# Patient Record
Sex: Male | Born: 2000 | Race: Black or African American | Hispanic: No | Marital: Single | Smoking: Never smoker
Health system: Southern US, Community
[De-identification: ages and names within clinical notes are randomized; demographics above are authoritative.]

## PROBLEM LIST (undated history)

## (undated) DIAGNOSIS — J45909 Unspecified asthma, uncomplicated: Secondary | ICD-10-CM

## (undated) HISTORY — PX: SHOULDER SURGERY: SHX246

## (undated) HISTORY — PX: ANTERIOR CRUCIATE LIGAMENT REPAIR: SHX115

---

## 2018-06-30 ENCOUNTER — Encounter (HOSPITAL_COMMUNITY): Payer: Self-pay

## 2018-06-30 ENCOUNTER — Emergency Department (HOSPITAL_COMMUNITY)
Admission: EM | Admit: 2018-06-30 | Discharge: 2018-07-01 | Disposition: A | Discharge status: Home / Self Care | Payer: Medicaid Other | Attending: Emergency Medicine | Admitting: Emergency Medicine

## 2018-06-30 ENCOUNTER — Emergency Department (HOSPITAL_COMMUNITY): Payer: Medicaid Other

## 2018-06-30 ENCOUNTER — Other Ambulatory Visit: Payer: Self-pay

## 2018-06-30 DIAGNOSIS — Y9389 Activity, other specified: Secondary | ICD-10-CM | POA: Diagnosis not present

## 2018-06-30 DIAGNOSIS — J45909 Unspecified asthma, uncomplicated: Secondary | ICD-10-CM | POA: Diagnosis not present

## 2018-06-30 DIAGNOSIS — Y92410 Unspecified street and highway as the place of occurrence of the external cause: Secondary | ICD-10-CM | POA: Insufficient documentation

## 2018-06-30 DIAGNOSIS — Y999 Unspecified external cause status: Secondary | ICD-10-CM | POA: Insufficient documentation

## 2018-06-30 DIAGNOSIS — M542 Cervicalgia: Secondary | ICD-10-CM | POA: Insufficient documentation

## 2018-06-30 DIAGNOSIS — M25512 Pain in left shoulder: Secondary | ICD-10-CM | POA: Diagnosis not present

## 2018-06-30 DIAGNOSIS — R200 Anesthesia of skin: Secondary | ICD-10-CM | POA: Diagnosis not present

## 2018-06-30 HISTORY — DX: Unspecified asthma, uncomplicated: J45.909

## 2018-06-30 MED ORDER — FENTANYL CITRATE (PF) 100 MCG/2ML IJ SOLN
25.0000 ug | Freq: Once | INTRAMUSCULAR | Status: AC
  Administered 2018-06-30: 25 ug via INTRAMUSCULAR
  Filled 2018-06-30: qty 2
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
## 2018-06-30 NOTE — Discharge Instructions (Signed)
Please wear the cervical collar until you follow up with neurosurgery. You were given a referral to the neurosurgery office. Please call the office to make an appointment for follow up.  You were given a prescription for Robaxin which is a muscle relaxer.  You should not drive, work, or operate machinery while taking this medication as it can make you very drowsy.   Please return to the ER sooner if you have any new or worsening symptoms.

## 2018-07-01 ENCOUNTER — Emergency Department (HOSPITAL_COMMUNITY): Payer: Medicaid Other

## 2018-07-01 MED ORDER — GADOBUTROL 1 MMOL/ML IV SOLN
7.5000 mL | Freq: Once | INTRAVENOUS | Status: AC | PRN
  Administered 2018-07-01: 5.5 mL via INTRAVENOUS

## 2018-07-01 MED ORDER — METHOCARBAMOL 500 MG PO TABS: 500.0000 mg | ORAL_TABLET | Freq: Two times a day (BID) | ORAL | 0 refills | Status: AC
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
## 2019-06-05 ENCOUNTER — Emergency Department (HOSPITAL_COMMUNITY): Payer: Medicaid Other

## 2019-06-05 ENCOUNTER — Encounter (HOSPITAL_COMMUNITY): Payer: Self-pay | Admitting: Emergency Medicine

## 2019-06-05 ENCOUNTER — Emergency Department (HOSPITAL_COMMUNITY)
Admission: EM | Admit: 2019-06-05 | Discharge: 2019-06-05 | Disposition: A | Discharge status: Home / Self Care | Payer: Medicaid Other | Attending: Emergency Medicine | Admitting: Emergency Medicine

## 2019-06-05 DIAGNOSIS — J45909 Unspecified asthma, uncomplicated: Secondary | ICD-10-CM | POA: Diagnosis not present

## 2019-06-05 DIAGNOSIS — Z79899 Other long term (current) drug therapy: Secondary | ICD-10-CM | POA: Diagnosis not present

## 2019-06-05 DIAGNOSIS — S41002A Unspecified open wound of left shoulder, initial encounter: Secondary | ICD-10-CM | POA: Diagnosis not present

## 2019-06-05 DIAGNOSIS — Y999 Unspecified external cause status: Secondary | ICD-10-CM | POA: Diagnosis not present

## 2019-06-05 DIAGNOSIS — Y939 Activity, unspecified: Secondary | ICD-10-CM | POA: Diagnosis not present

## 2019-06-05 DIAGNOSIS — W3400XA Accidental discharge from unspecified firearms or gun, initial encounter: Secondary | ICD-10-CM

## 2019-06-05 DIAGNOSIS — Y929 Unspecified place or not applicable: Secondary | ICD-10-CM | POA: Diagnosis not present

## 2019-06-05 LAB — I-STAT CHEM 8, ED
BUN: 10 mg/dL (ref 6–20)
Calcium, Ion: 1.18 mmol/L (ref 1.15–1.40)
Chloride: 101 mmol/L (ref 98–111)
Creatinine, Ser: 0.8 mg/dL (ref 0.61–1.24)
Glucose, Bld: 104 mg/dL — ABNORMAL HIGH (ref 70–99)
HCT: 52 % (ref 39.0–52.0)
Hemoglobin: 17.7 g/dL — ABNORMAL HIGH (ref 13.0–17.0)
Potassium: 3.4 mmol/L — ABNORMAL LOW (ref 3.5–5.1)
Sodium: 141 mmol/L (ref 135–145)
TCO2: 25 mmol/L (ref 22–32)

## 2019-06-05 LAB — COMPREHENSIVE METABOLIC PANEL
ALT: 56 U/L — ABNORMAL HIGH (ref 0–44)
AST: 41 U/L (ref 15–41)
Albumin: 4.6 g/dL (ref 3.5–5.0)
Alkaline Phosphatase: 88 U/L (ref 38–126)
Anion gap: 16 — ABNORMAL HIGH (ref 5–15)
BUN: 11 mg/dL (ref 6–20)
CO2: 22 mmol/L (ref 22–32)
Calcium: 9.8 mg/dL (ref 8.9–10.3)
Chloride: 102 mmol/L (ref 98–111)
Creatinine, Ser: 0.93 mg/dL (ref 0.61–1.24)
GFR calc Af Amer: >60 mL/min (ref >60)
GFR calc non Af Amer: >60 mL/min (ref >60)
Glucose, Bld: 102 mg/dL — ABNORMAL HIGH (ref 70–99)
Potassium: 3.4 mmol/L — ABNORMAL LOW (ref 3.5–5.1)
Sodium: 140 mmol/L (ref 135–145)
Total Bilirubin: 2.3 mg/dL — ABNORMAL HIGH (ref 0.3–1.2)
Total Protein: 7.4 g/dL (ref 6.5–8.1)

## 2019-06-05 LAB — CBC
HCT: 52.2 % — ABNORMAL HIGH (ref 39.0–52.0)
Hemoglobin: 18 g/dL — ABNORMAL HIGH (ref 13.0–17.0)
MCH: 30.1 pg (ref 26.0–34.0)
MCHC: 34.5 g/dL (ref 30.0–36.0)
MCV: 87.1 fL (ref 80.0–100.0)
Platelets: 258 10*3/uL (ref 150–400)
RBC: 5.99 MIL/uL — ABNORMAL HIGH (ref 4.22–5.81)
RDW: 11.9 % (ref 11.5–15.5)
WBC: 12.4 10*3/uL — ABNORMAL HIGH (ref 4.0–10.5)
nRBC: 0 % (ref 0.0–0.2)

## 2019-06-05 LAB — SAMPLE TO BLOOD BANK

## 2019-06-05 LAB — ETHANOL: Alcohol, Ethyl (B): <10 mg/dL (ref <10)

## 2019-06-05 LAB — CDS SEROLOGY

## 2019-06-05 LAB — PROTIME-INR
INR: 1 (ref 0.8–1.2)
Prothrombin Time: 13.3 seconds (ref 11.4–15.2)

## 2019-06-05 MED ORDER — ACETAMINOPHEN 500 MG PO TABS
1000.0000 mg | ORAL_TABLET | Freq: Once | ORAL | Status: AC
  Administered 2019-06-05: 1000 mg via ORAL
  Filled 2019-06-05: qty 2

## 2019-06-05 MED ORDER — ACETAMINOPHEN 500 MG PO TABS: 1000.0000 mg | ORAL_TABLET | Freq: Three times a day (TID) | ORAL | 0 refills | Status: AC

## 2019-06-05 MED ORDER — SODIUM CHLORIDE 0.9 % IV BOLUS
1000.0000 mL | Freq: Once | INTRAVENOUS | Status: AC
  Administered 2019-06-05: 1000 mL via INTRAVENOUS

## 2019-06-05 MED ORDER — IOHEXOL 300 MG/ML  SOLN
75.0000 mL | Freq: Once | INTRAMUSCULAR | Status: AC | PRN
  Administered 2019-06-05: 75 mL via INTRAVENOUS

## 2019-06-05 MED ORDER — FENTANYL CITRATE (PF) 100 MCG/2ML IJ SOLN
50.0000 ug | Freq: Once | INTRAMUSCULAR | Status: AC
  Administered 2019-06-05: 50 ug via INTRAVENOUS
  Filled 2019-06-05: qty 2
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
## 2019-06-05 NOTE — ED Notes (Signed)
Grandmother who is emergency contact, Frederich Cha [phone]. Asking for an update whenever someone is available
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
## 2019-06-07 ENCOUNTER — Encounter (HOSPITAL_COMMUNITY): Payer: Self-pay

## 2019-11-10 ENCOUNTER — Other Ambulatory Visit: Payer: Self-pay

## 2019-11-10 ENCOUNTER — Emergency Department (HOSPITAL_COMMUNITY): Payer: Medicaid Other

## 2019-11-10 ENCOUNTER — Emergency Department (HOSPITAL_COMMUNITY)
Admission: EM | Admit: 2019-11-10 | Discharge: 2019-11-10 | Disposition: A | Discharge status: Home / Self Care | Payer: Medicaid Other | Attending: Emergency Medicine | Admitting: Emergency Medicine

## 2019-11-10 DIAGNOSIS — J45909 Unspecified asthma, uncomplicated: Secondary | ICD-10-CM | POA: Insufficient documentation

## 2019-11-10 DIAGNOSIS — Y999 Unspecified external cause status: Secondary | ICD-10-CM | POA: Insufficient documentation

## 2019-11-10 DIAGNOSIS — Y93B9 Activity, other involving muscle strengthening exercises: Secondary | ICD-10-CM | POA: Diagnosis not present

## 2019-11-10 DIAGNOSIS — X509XXA Other and unspecified overexertion or strenuous movements or postures, initial encounter: Secondary | ICD-10-CM | POA: Insufficient documentation

## 2019-11-10 DIAGNOSIS — M25512 Pain in left shoulder: Secondary | ICD-10-CM | POA: Diagnosis not present

## 2019-11-10 DIAGNOSIS — Y9239 Other specified sports and athletic area as the place of occurrence of the external cause: Secondary | ICD-10-CM | POA: Insufficient documentation

## 2019-11-10 MED ORDER — IBUPROFEN 600 MG PO TABS: 600.0000 mg | ORAL_TABLET | Freq: Three times a day (TID) | ORAL | 0 refills | Status: AC | PRN

## 2019-11-10 NOTE — Discharge Instructions (Signed)
Please return for any problem.  Follow-up with your regular orthopedic specialist as instructed.
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
## 2020-03-11 ENCOUNTER — Inpatient Hospital Stay
Admit: 2020-03-11 | Discharge: 2020-03-11 | Disposition: A | Discharge status: Home / Self Care | Payer: BLUE CROSS/BLUE SHIELD | Attending: Emergency Medicine

## 2020-03-11 DIAGNOSIS — R5381 Other malaise: Secondary | ICD-10-CM

## 2020-03-11 LAB — COMPREHENSIVE METABOLIC PANEL
ALT: 34 U/L (ref 12–78)
AST: 21 U/L (ref 15–37)
Albumin: 4.3 gm/dl (ref 3.4–5.0)
Alkaline Phosphatase: 81 U/L (ref 45–117)
Anion Gap: 6 mmol/L (ref 5–15)
BUN: 7 mg/dl (ref 7–25)
CO2: 28 mEq/L (ref 21–32)
Calcium: 9.4 mg/dl (ref 8.5–10.1)
Chloride: 105 mEq/L (ref 98–107)
Creatinine: 0.8 mg/dl (ref 0.6–1.3)
EGFR IF NonAfrican American: >60
GFR African American: >60
Glucose: 104 mg/dl (ref 74–106)
Potassium: 3.7 mEq/L (ref 3.5–5.1)
Sodium: 140 mEq/L (ref 136–145)
Total Bilirubin: 4.4 mg/dl — ABNORMAL HIGH (ref 0.2–1.0)
Total Protein: 7.4 gm/dl (ref 6.4–8.2)

## 2020-03-11 LAB — CBC WITH AUTO DIFFERENTIAL
Basophils %: 0.5 % (ref 0–3)
Eosinophils %: 0.7 % (ref 0–5)
Hematocrit: 46.4 % (ref 37.0–50.0)
Hemoglobin: 16.2 gm/dl (ref 12.4–17.2)
Immature Granulocytes: 0.3 % (ref 0.0–3.0)
Lymphocytes %: 23.2 % — ABNORMAL LOW (ref 28–48)
MCH: 29.3 pg (ref 23.0–34.6)
MCHC: 34.9 gm/dl (ref 30.0–36.0)
MCV: 83.9 fL (ref 80.0–98.0)
MPV: 9.1 fL (ref 6.0–10.0)
Monocytes %: 6.6 % (ref 1–13)
Neutrophils %: 68.7 % — ABNORMAL HIGH (ref 34–64)
Nucleated RBCs: 0 (ref 0–0)
Platelets: 276 10*3/uL (ref 140–450)
RBC: 5.53 M/uL (ref 3.80–5.70)
RDW-SD: 36 (ref 35.1–43.9)
WBC: 12.2 10*3/uL — ABNORMAL HIGH (ref 4.0–11.0)

## 2020-03-11 LAB — LIPASE
Lipase: 40 U/L — ABNORMAL LOW (ref 73–393)
Lipase: 40 U/L — ABNORMAL LOW (ref 73–393)

## 2020-03-11 LAB — METABOLIC PANEL, COMPREHENSIVE
ALT (SGPT): 34 U/L (ref 12–78)
AST (SGOT): 21 U/L (ref 15–37)
Albumin: 4.3 gm/dl (ref 3.4–5.0)
Alk. phosphatase: 81 U/L (ref 45–117)
Anion gap: 6 mmol/L (ref 5–15)
BUN: 7 mg/dl (ref 7–25)
Bilirubin, total: 4.4 mg/dl — ABNORMAL HIGH (ref 0.2–1.0)
CO2: 28 mEq/L (ref 21–32)
Calcium: 9.4 mg/dl (ref 8.5–10.1)
Chloride: 105 mEq/L (ref 98–107)
Creatinine: 0.8 mg/dl (ref 0.6–1.3)
GFR est AA: >60
GFR est non-AA: >60
Glucose: 104 mg/dl (ref 74–106)
Potassium: 3.7 mEq/L (ref 3.5–5.1)
Protein, total: 7.4 gm/dl (ref 6.4–8.2)
Sodium: 140 mEq/L (ref 136–145)

## 2020-03-11 LAB — CBC WITH AUTOMATED DIFF
BASOPHILS: 0.5 % (ref 0–3)
EOSINOPHILS: 0.7 % (ref 0–5)
HCT: 46.4 % (ref 37.0–50.0)
HGB: 16.2 gm/dl (ref 12.4–17.2)
IMMATURE GRANULOCYTES: 0.3 % (ref 0.0–3.0)
LYMPHOCYTES: 23.2 % — ABNORMAL LOW (ref 28–48)
MCH: 29.3 pg (ref 23.0–34.6)
MCHC: 34.9 gm/dl (ref 30.0–36.0)
MCV: 83.9 fL (ref 80.0–98.0)
MONOCYTES: 6.6 % (ref 1–13)
MPV: 9.1 fL (ref 6.0–10.0)
NEUTROPHILS: 68.7 % — ABNORMAL HIGH (ref 34–64)
NRBC: 0 (ref 0–0)
PLATELET: 276 10*3/uL (ref 140–450)
RBC: 5.53 M/uL (ref 3.80–5.70)
RDW-SD: 36 (ref 35.1–43.9)
WBC: 12.2 10*3/uL — ABNORMAL HIGH (ref 4.0–11.0)

## 2020-03-11 MED ORDER — DICYCLOMINE 10 MG/ML IM SOLN
10 mg/mL | INTRAMUSCULAR | Status: DC
Start: 2020-03-11 — End: 2020-03-11

## 2020-03-11 MED ORDER — DICYCLOMINE 10 MG CAP
10 mg | ORAL_CAPSULE | Freq: Three times a day (TID) | ORAL | 0 refills | Status: AC
Start: 2020-03-11 — End: ?

## 2020-03-11 MED ORDER — SODIUM CHLORIDE 0.9% BOLUS IV
0.9 % | INTRAVENOUS | Status: AC
Start: 2020-03-11 — End: 2020-03-11
  Administered 2020-03-11: 09:00:00 via INTRAVENOUS

## 2020-03-11 MED ORDER — SODIUM CHLORIDE 0.9 % IJ SYRG
Freq: Once | INTRAMUSCULAR | Status: AC
Start: 2020-03-11 — End: 2020-03-11
  Administered 2020-03-11: 09:00:00 via INTRAVENOUS

## 2020-03-11 MED FILL — DICYCLOMINE 10 MG/ML IM SOLN: 10 mg/mL | INTRAMUSCULAR | Qty: 2

## 2020-03-11 NOTE — ED Notes (Signed)
Registration at bedside.

## 2020-03-11 NOTE — ED Notes (Signed)
6:28 AM  03/11/20     Discharge instructions given to Duwayne Heck (name) with verbalization of understanding. Patient accompanied by self.  Patient discharged with the following prescriptions Bentyl. Patient discharged to home (destination).      Marisue Brooklyn, RN

## 2020-03-11 NOTE — ED Provider Notes (Signed)
ED Provider Notes by Ladona Mow, PA-C at 03/11/20 [phone]                Author: Ladona Mow, PA-C  Service: EMERGENCY  Author Type: Physician Assistant       Filed: 03/11/20 0558  Date of Service: 03/11/20 0439  Status: Attested           Editor: Ladona Mow, PA-C (Physician Assistant)  Cosigner: Susa Raring, MD at 03/11/20 [phone]          Attestation signed by Susa Raring, MD at 03/11/20 [phone]          I was personally available for consultation in the emergency department.  I have reviewed the chart and agree with the documentation recorded by the [facility], including  the assessment, treatment plan, and disposition.   Susa Raring, [place]   Emergency Department Treatment Report                Patient: Marc Schmidt  Age: 19 y.o.  Sex: male          Date of Birth: [birth year]  Admit Date: 03/11/2020  PCP: Alma Downs, Not On File     MRN: [id number]   CSN: [id number]   MD Susa Raring, MD         Room: ER08/ER08  Time Dictated: 4:39 AM  APP: Ladona Mow, PA-C            Chief Complaint    anxiety        History of Present Illness     19 y.o. male  with history of seasonal allergies and asthma, presents with chief complaint of fatigue, headaches, loss of appetite that has been going on for several days, associated with diarrhea, liquid, brown, not black not bloody.  Crampy abdominal pain, diffuse,  non radiating, no palliative measures attempted.       Denies fever, cough, shortness of breath, chest pain, abdominal pain, N/V.    States, "I just came in to get some fluid in my body because  I can't keep anything down" but denies vomiting, states" it just goes right through me.    Vaccinated for covid x 2.    Patient has been triaged as complaining of anxiety, however denies CM denies homicidal ideation suicidal ideation, AV hallucinations.  Does have multiple questions about psychiatric related medications such as Zoloft, Lexapro,  BuSpar, but states "I do  not take those medications anymore ".     Review of Systems     Review of Systems    Constitutional: Positive for malaise/fatigue. Negative for chills and fever.    HENT: Negative for congestion.     Eyes: Negative for blurred vision.    Respiratory: Negative for cough and shortness of breath.     Cardiovascular: Negative for chest pain.    Gastrointestinal: Positive for abdominal pain and diarrhea . Negative for blood in stool, melena, nausea and vomiting.    Genitourinary: Negative for dysuria and flank pain.    Musculoskeletal: Negative for myalgias.    Skin: Negative for rash.    Neurological: Positive for headaches. Negative for loss of consciousness.    All other systems reviewed and are negative.  Past Medical/Surgical History     History reviewed. No pertinent past medical history.   No past surgical history on file.        Social History          Social History          Socioeconomic History         ?  Marital status:  SINGLE              Spouse name:  Not on file         ?  Number of children:  Not on file     ?  Years of education:  Not on file     ?  Highest education level:  Not on file       Occupational History        ?  Not on file       Tobacco Use         ?  Smoking status:  Never Smoker     ?  Smokeless tobacco:  Never Used       Substance and Sexual Activity         ?  Alcohol use:  Not Currently     ?  Drug use:  Yes              Types:  Marijuana         ?  Sexual activity:  Not Currently        Other Topics  Concern        ?  Not on file       Social History Narrative        ?  Not on file          Social Determinants of Health          Financial Resource Strain:         ?  Difficulty of Paying Living Expenses:        Food Insecurity:         ?  Worried About Charity fundraiser in the Last Year:      ?  Arboriculturist in the Last Year:        Transportation Needs:         ?  Film/video editor (Medical):      ?  Lack of Transportation (Non-Medical):         Physical Activity:         ?  Days of Exercise per Week:      ?  Minutes of Exercise per Session:        Stress:         ?  Feeling of Stress :        Social Connections:         ?  Frequency of Communication with Friends and Family:      ?  Frequency of Social Gatherings with Friends and Family:      ?  Attends Religious Services:      ?  Active Member of Clubs or Organizations:      ?  Attends Archivist Meetings:      ?  Marital Status:        Intimate Partner Violence:         ?  Fear of Current or Ex-Partner:      ?  Emotionally Abused:      ?  Physically Abused:         ?  Sexually Abused:              Family History     History reviewed. No pertinent family history.        Current Medications             Allergies          Allergies        Allergen  Reactions         ?  Penicillins  Swelling             Physical Exam          ED Triage Vitals [03/11/20 0334]     ED Encounter Vitals Group           BP  124/69        Pulse (Heart Rate)  77        Resp Rate  22        Temp  98 ??F (36.7 ??C)        Temp src          O2 Sat (%)  100 %        Weight  130 lb           Height  '5\' 4"'         Physical Exam   Vitals and nursing note reviewed.   Constitutional:        General: He is not in acute distress.     Appearance: He is not ill-appearing, toxic-appearing or diaphoretic.    HENT:       Head: Normocephalic and atraumatic.      Nose: Nose normal.      Mouth/Throat:      Mouth: Mucous membranes are moist.    Eyes:       General: No scleral icterus.        Right eye: No discharge.         Left eye: No discharge.      Pupils: Pupils are equal, round, and reactive to light.   Neck:       Thyroid: No thyromegaly.   Cardiovascular :       Rate and Rhythm: Normal rate and regular rhythm.      Heart sounds: Normal heart sounds.    Pulmonary:       Effort: Pulmonary effort is normal.      Breath sounds: No decreased breath sounds or wheezing.   Abdominal:      General: Bowel sounds are normal.  There is no  distension.      Palpations: Abdomen is soft.      Tenderness: There is no abdominal tenderness.     Musculoskeletal:          General: No tenderness. Normal range of motion.      Cervical back: Normal range of motion and neck supple.     Lymphadenopathy:       Cervical: No cervical adenopathy.   Skin :      General: Skin is warm and dry.      Capillary Refill: Capillary refill takes less than 2 seconds.    Neurological:       General: No focal deficit present.      Mental Status: He is alert and oriented to person, place, and time.    Psychiatric:         Mood and Affect: Affect normal.  Impression and Management Plan     19 year old male presenting with complaint of fatigue malaise headaches and diarrhea with crampy abdominal pain.  Vital signs stable, resting in bed with eyes closed my initial assessment,  nonfocal abdominal exam will obtain basic labs,  hydrate with IV fluids as he states "it runs right through me "when he drinks water, disposition pending result.        Diagnostic Studies     Lab:      Recent Results (from the past 12 hour(s))     CBC WITH AUTOMATED DIFF          Collection Time: 03/11/20  4:51 AM         Result  Value  Ref Range            WBC  12.2 (H)  4.0 - 11.0 1000/mm3       RBC  5.53  3.80 - 5.70 M/uL       HGB  16.2  12.4 - 17.2 gm/dl       HCT  46.4  37.0 - 50.0 %       MCV  83.9  80.0 - 98.0 fL       MCH  29.3  23.0 - 34.6 pg       MCHC  34.9  30.0 - 36.0 gm/dl       PLATELET  276  140 - 450 1000/mm3       MPV  9.1  6.0 - 10.0 fL       RDW-SD  36.0  35.1 - 43.9         NRBC  0  0 - 0         IMMATURE GRANULOCYTES  0.3  0.0 - 3.0 %       NEUTROPHILS  68.7 (H)  34 - 64 %       LYMPHOCYTES  23.2 (L)  28 - 48 %       MONOCYTES  6.6  1 - 13 %       EOSINOPHILS  0.7  0 - 5 %       BASOPHILS  0.5  0 - 3 %       METABOLIC PANEL, COMPREHENSIVE          Collection Time: 03/11/20  4:51 AM         Result  Value  Ref Range            Sodium  140  136 - 145 mEq/L            Potassium   3.7  3.5 - 5.1 mEq/L            Chloride  105  98 - 107 mEq/L       CO2  28  21 - 32 mEq/L       Glucose  104  74 - 106 mg/dl       BUN  7  7 - 25 mg/dl       Creatinine  0.8  0.6 - 1.3 mg/dl       GFR est AA  >60          GFR est non-AA  >60          Calcium  9.4  8.5 - 10.1 mg/dl       AST (SGOT)  21  15 - 37 U/L       ALT (SGPT)  34  12 - 78 U/L  Alk. phosphatase  81  45 - 117 U/L       Bilirubin, total  4.4 (H)  0.2 - 1.0 mg/dl       Protein, total  7.4  6.4 - 8.2 gm/dl       Albumin  4.3  3.4 - 5.0 gm/dl       Anion gap  6  5 - 15 mmol/L       LIPASE          Collection Time: 03/11/20  4:51 AM         Result  Value  Ref Range            Lipase  40 (L)  73 - 393 U/L          Labs Reviewed       CBC WITH AUTOMATED DIFF - Abnormal; Notable for the following components:            Result  Value            WBC  12.2 (*)         NEUTROPHILS  68.7 (*)         LYMPHOCYTES  23.2 (*)            All other components within normal limits       METABOLIC PANEL, COMPREHENSIVE - Abnormal; Notable for the following components:            Bilirubin, total  4.4 (*)            All other components within normal limits       LIPASE - Abnormal; Notable for the following components:            Lipase  40 (*)            All other components within normal limits           Imaging:     No results found.        ED Course               Medications       dicyclomine (BENTYL) 10 mg/mL injection 20 mg (20 mg IntraMUSCular Refused 03/11/20 0455)       sodium chloride (NS) flush 5-10 mL (10 mL IntraVENous Given 03/11/20 0455)       sodium chloride 0.9 % bolus infusion 1,000 mL (1,000 mL IntraVENous New Bag 03/11/20 0454)          Medical Decision Making     19 year old male complaining of intermittent diarrhea, malaise and fatigue, crampy abdominal pain that he states is not present when he was given Bentyl by the nurse.  Abdomen is  soft, nontender.  There is no right upper quadrant tenderness to palpation.  He does not have scleral icterus  and does not appear jaundiced however his bilirubin is elevated.  Other LFTs and lipase normal.  Has been elevated in the past upon chart review  at outside facilities.  He does have multiple questions about psychiatric medications as listed above however denies psychiatric related complaint including homicidal or suicidal ideation, anxiety, or audiovisual hallucinations.  He was given 1 L of normal  saline, see labs above, and a prescription for Bentyl.  He is given return precautions and a primary care provider to follow-up with, and is discharged hemodynamically stable condition.        Final Diagnosis  ICD-10-CM  ICD-9-CM          1.  Malaise and fatigue   R53.81  780.79           R53.83            2.  Diarrhea, unspecified type   R19.7  787.91     3.  Acute nonintractable headache, unspecified headache type   R51.9  784.0          4.  Abdominal pain, generalized   R10.84  789.07             Disposition     Discharged home         The patient was personally evaluated by myself and discussed with Dr. Tito Dine, Sharion Dove, MD who agrees with the above assessment and plan.   Nani Skillern, PA-C   March 11, 2020      My signature above authenticates this document and my orders, the final     diagnosis (es), discharge prescription (s), and instructions in the Epic     record.   If you have any questions please contact [phone].       Nursing notes have been reviewed by the physician/ advanced practice     Clinician.      Dragon medical dictation software was used for portions of this report. Unintended voice recognition errors may occur.

## 2020-03-11 NOTE — ED Notes (Signed)
 PIV access obtained; labs drawn & sent. Pt tolerated well.     Fluids infusing as ordered.    Pt refused dose of Bentyl; stated I'm not having cramps or stomach pain right now.
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
